# Patient Record
Sex: Male | Born: 1960 | Race: Black or African American | Hispanic: No | Marital: Married | Smoking: Current every day smoker
Health system: Southern US, Community
[De-identification: ages and names within clinical notes are randomized; demographics above are authoritative.]

---

## 2006-03-25 ENCOUNTER — Ambulatory Visit: Payer: Self-pay | Admitting: General Practice

## 2006-04-26 ENCOUNTER — Ambulatory Visit: Payer: Self-pay | Admitting: Urology

## 2007-05-10 IMAGING — CR DG WRIST COMPLETE 3+V*R*
1 series · 5 of 5 positions shown · non-contrast
Comparison: none

REASON FOR EXAM: Pain in right wrist
           Call results to physician at: [phone]
COMMENTS:

PROCEDURE:     DXR - DXR WRIST RT COMP WITH OBLIQUES  - March 25, 2006  [DATE]
RESULT:     There does not appear to be evidence of fracture, dislocation or
malalignment.

[Series 1: view not recorded · 0.17mm/px · 5 of 5 slices shown]
[im 1/5]
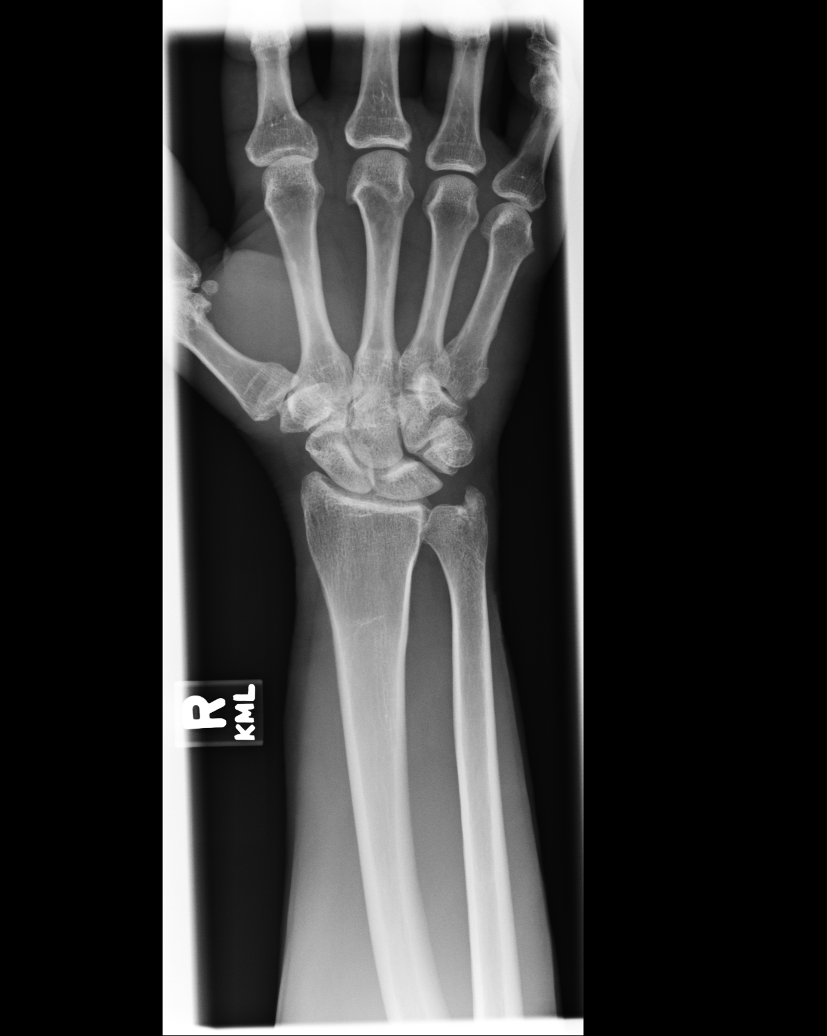
[im 2/5]
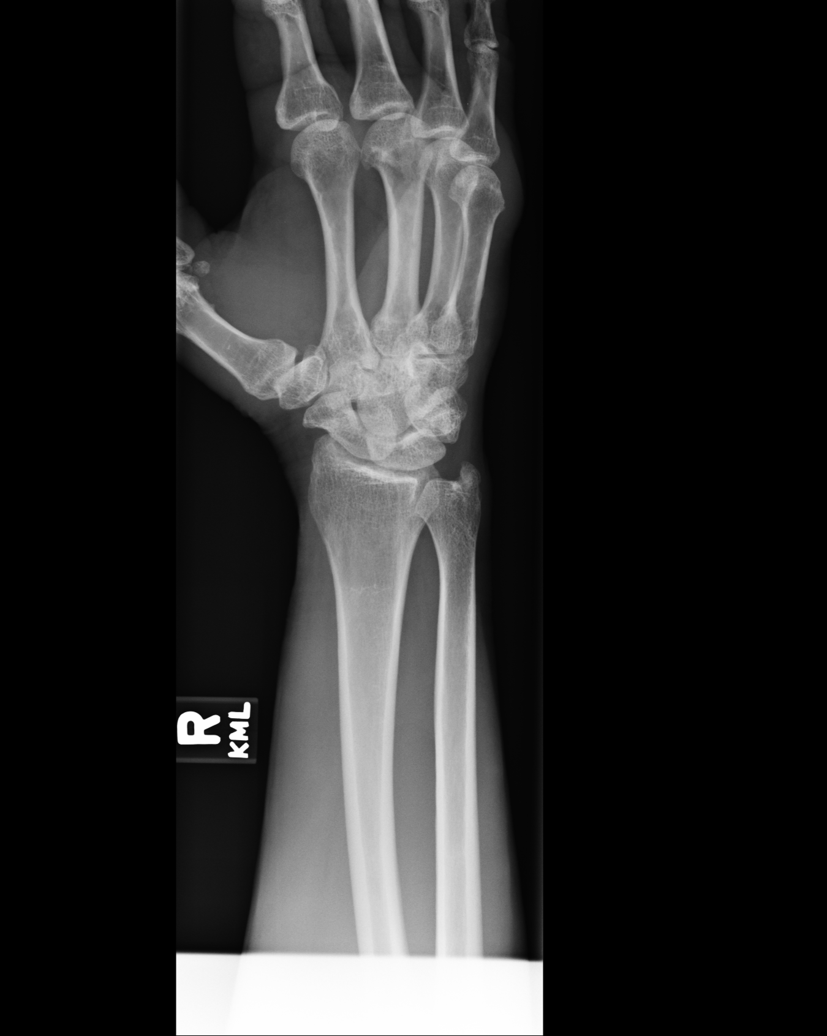
[im 3/5]
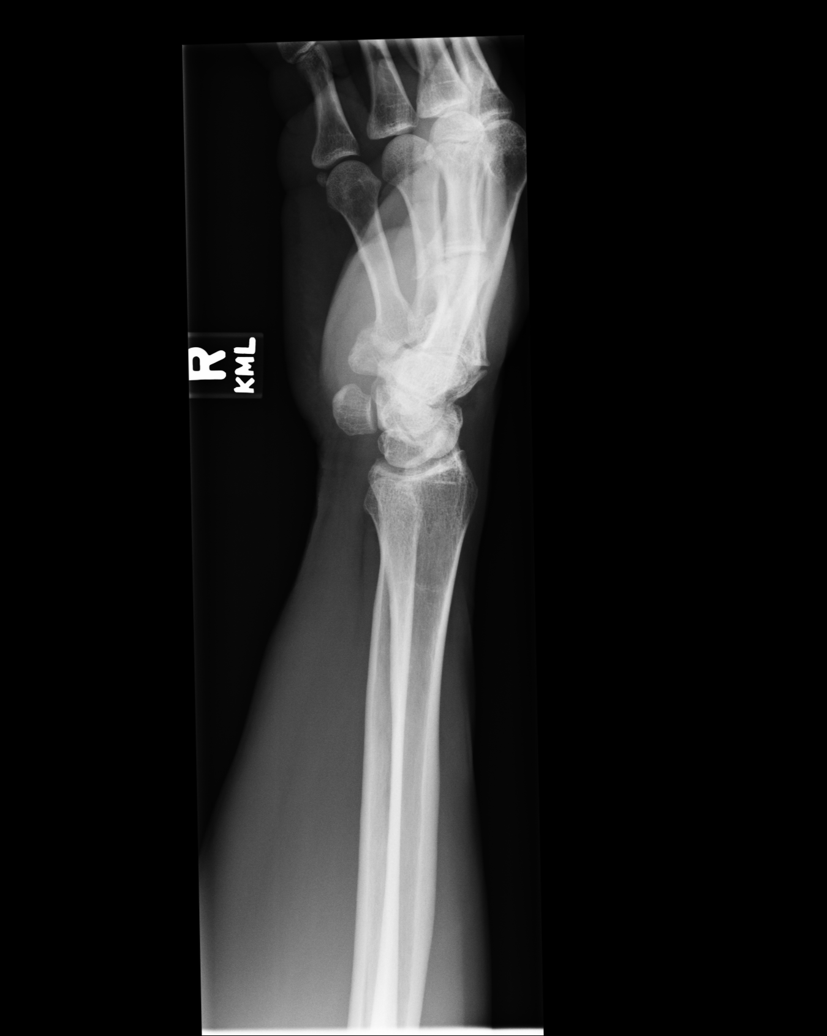
[im 4/5]
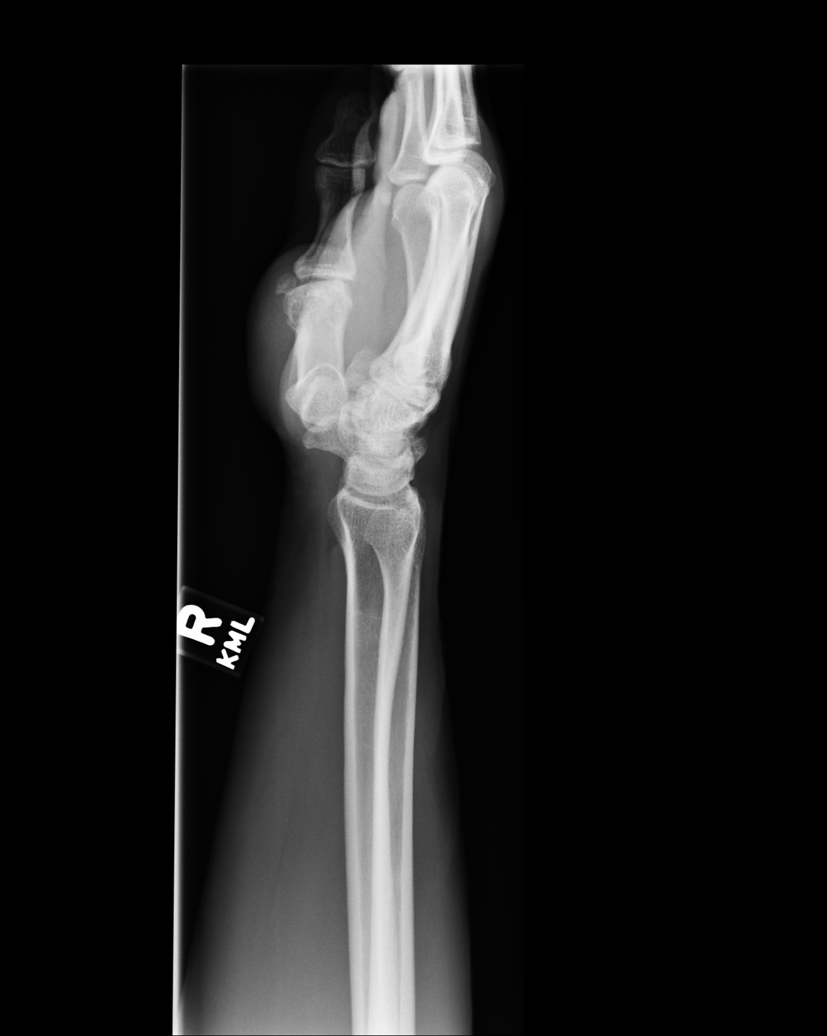
[im 5/5]
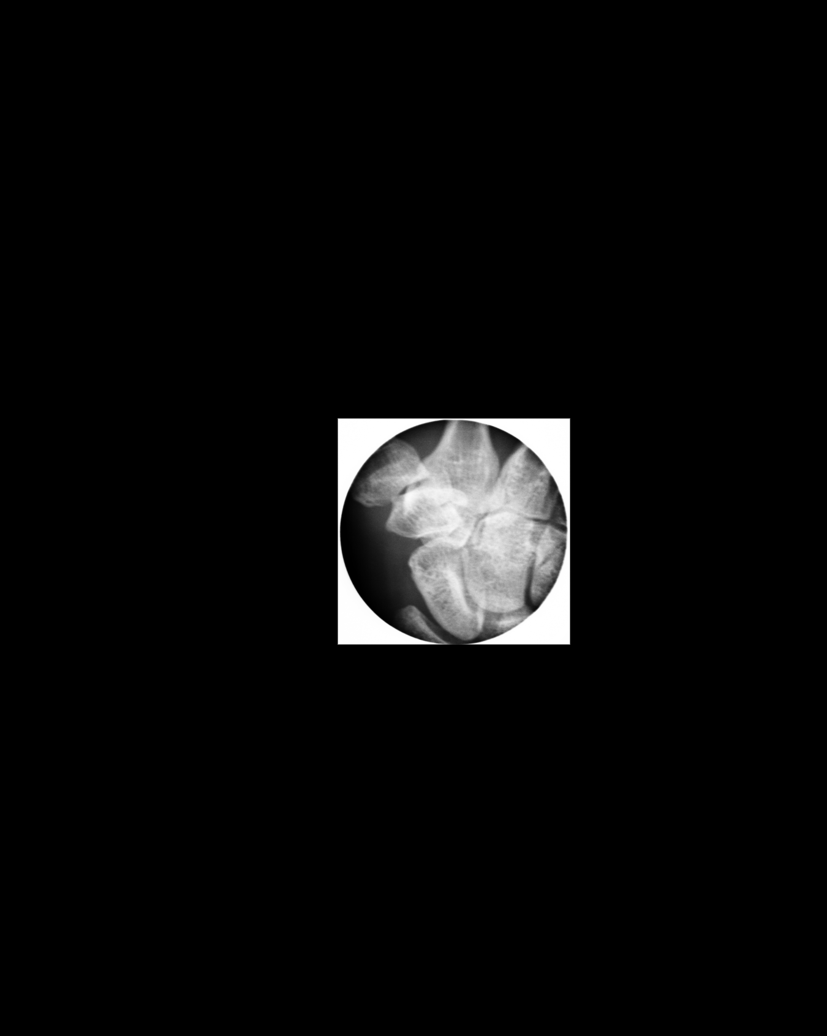

[5 of 5 positions shown; findings below may reference images not displayed]

IMPRESSION: 1.     Unremarkable RIGHT wrist.
2.     If there is persistent clinical concern or persistent complaints of
pain, repeat evaluation in 7-10 days is recommended, if clinically
warranted.

## 2015-09-21 ENCOUNTER — Emergency Department
Admission: EM | Admit: 2015-09-21 | Discharge: 2015-09-21 | Disposition: A | Discharge status: Home / Self Care | Payer: BLUE CROSS/BLUE SHIELD | Attending: Emergency Medicine | Admitting: Emergency Medicine

## 2015-09-21 DIAGNOSIS — Z720 Tobacco use: Secondary | ICD-10-CM | POA: Insufficient documentation

## 2015-09-21 DIAGNOSIS — K047 Periapical abscess without sinus: Secondary | ICD-10-CM | POA: Insufficient documentation

## 2015-09-21 MED ORDER — AMOXICILLIN 500 MG PO TABS: 500.0000 mg | ORAL_TABLET | Freq: Three times a day (TID) | ORAL | Status: AC

## 2015-09-21 MED ORDER — IBUPROFEN 800 MG PO TABS: 800.0000 mg | ORAL_TABLET | Freq: Three times a day (TID) | ORAL | Status: AC | PRN

## 2015-09-21 MED ORDER — TRAMADOL HCL 50 MG PO TABS: 50.0000 mg | ORAL_TABLET | Freq: Four times a day (QID) | ORAL | Status: AC | PRN

## 2015-09-21 NOTE — ED Provider Notes (Signed)
[facility], [facility] Emergency Department Provider Note ____________________________________________  Time seen: Approximately 3:19 PM  I have reviewed the triage vital signs and the nursing notes.   HISTORY  Chief Complaint Facial Swelling   HPI Cameron Hawkins is a 54 y.o. male who presents to the emergency department for evaluation of facial swelling. He noticed the swelling 3 days ago and it has progressively worsened. He states that quite some time ago a tooth broke in that area and believes that he now has some infection which is causing the swelling. He reports that the area is becoming quite tender. He states that he came to the emergency department today so that he could get on antibiotics before he goes to a dentist. He has not been taking anything for the pain. He has not been on any antibiotics in the last 30 days.   History reviewed. No pertinent past medical history.  There are no active problems to display for this patient.   History reviewed. No pertinent past surgical history.  Current Outpatient Rx  Name  Route  Sig  Dispense  Refill  . amoxicillin (AMOXIL) 500 MG tablet   Oral   Take 1 tablet (500 mg total) by mouth 3 (three) times daily.   30 tablet   0   . ibuprofen (ADVIL,MOTRIN) 800 MG tablet   Oral   Take 1 tablet (800 mg total) by mouth every 8 (eight) hours as needed.   30 tablet   0   . traMADol (ULTRAM) 50 MG tablet   Oral   Take 1 tablet (50 mg total) by mouth every 6 (six) hours as needed.   9 tablet   0     Allergies Review of patient's allergies indicates no known allergies.  History reviewed. No pertinent family history.  Social History Social History  Substance Use Topics  . Smoking status: Current Every Day Smoker  . Smokeless tobacco: None  . Alcohol Use: Yes    Review of Systems Constitutional: No fever/chills Eyes: No visual changes. ENT: No sore throat. Cardiovascular: Denies chest pain. Respiratory:  Denies shortness of breath. Gastrointestinal: No abdominal pain.  No nausea, no vomiting.  Genitourinary: Negative for dysuria. Musculoskeletal: Negative for back pain. Skin: Negative for rash. Neurological: Negative for headaches, focal weakness or numbness. 10-point ROS otherwise negative.  ____________________________________________   PHYSICAL EXAM:  VITAL SIGNS: ED Triage Vitals  Enc Vitals Group     BP 09/21/15 1431 125/84 mmHg     Pulse Rate 09/21/15 1431 89     Resp 09/21/15 1431 18     Temp 09/21/15 1431 97.9 F (36.6 C)     Temp Source 09/21/15 1431 Oral     SpO2 09/21/15 1431 99 %     Weight 09/21/15 1431 140 lb (63.504 kg)     Height --      Head Cir --      Peak Flow --      Pain Score 09/21/15 1432 8     Pain Loc --      Pain Edu? --      Excl. in GC? --     Constitutional: Alert and oriented. Well appearing and in no acute distress. Eyes: Conjunctivae are normal. PERRL. EOMI. Head: Atraumatic. Nose: No congestion/rhinnorhea. Mouth/Throat: Mucous membranes are moist.  Oropharynx non-erythematous. Periodontal Exam    Neck: No stridor.  Hematological/Lymphatic/Immunilogical: No cervical lymphadenopathy. Cardiovascular:   Good peripheral circulation. Respiratory: Normal respiratory effort.  No retractions. Musculoskeletal: No lower extremity  tenderness nor edema.  No joint effusions. Neurologic:  Normal speech and language. No gross focal neurologic deficits are appreciated. Speech is normal. No gait instability. Skin:  Skin is warm, dry and intact. No rash noted. Psychiatric: Mood and affect are normal. Speech and behavior are normal.  ____________________________________________   LABS (all labs ordered are listed, but only abnormal results are displayed)  Labs Reviewed - No data to display ____________________________________________   RADIOLOGY   ____________________________________________   PROCEDURES  Procedure(s) performed:    Critical Care performed:   ____________________________________________   INITIAL IMPRESSION / ASSESSMENT AND PLAN / ED COURSE  Pertinent labs & imaging results that were available during my care of the patient were reviewed by me and considered in my medical decision making (see chart for details).  Patient was advised to see the dentist within 14 days. Also advised to take the antibiotic until finished. Instructed to return to the ER for symptoms that change or worsen if you are unable to schedule an appointment. ____________________________________________   FINAL CLINICAL IMPRESSION(S) / ED DIAGNOSES  Final diagnoses:  Dental abscess      Chinita Pester, FNP 09/21/15 1530  Jeanmarie Plant, MD 09/21/15 [phone]

## 2015-09-21 NOTE — ED Notes (Signed)
Pt c/o swelling to face and dental pain x2 days.

## 2015-09-21 NOTE — Discharge Instructions (Signed)
Dental Abscess °A dental abscess is a collection of pus in or around a tooth. °CAUSES °This condition is caused by a bacterial infection around the root of the tooth that involves the inner part of the tooth (pulp). It may result from: °· Severe tooth decay. °· Trauma to the tooth that allows bacteria to enter into the pulp, such as a broken or chipped tooth. °· Severe gum disease around a tooth. °SYMPTOMS °Symptoms of this condition include: °· Severe pain in and around the infected tooth. °· Swelling and redness around the infected tooth, in the mouth, or in the face. °· Tenderness. °· Pus drainage. °· Bad breath. °· Bitter taste in the mouth. °· Difficulty swallowing. °· Difficulty opening the mouth. °· Nausea. °· Vomiting. °· Chills. °· Swollen neck glands. °· Fever. °DIAGNOSIS °This condition is diagnosed with examination of the infected tooth. During the exam, your dentist may tap on the infected tooth. Your dentist will also ask about your medical and dental history and may order X-rays. °TREATMENT °This condition is treated by eliminating the infection. This may be done with: °· Antibiotic medicine. °· A root canal. This may be performed to save the tooth. °· Pulling (extracting) the tooth. This may also involve draining the abscess. This is done if the tooth cannot be saved. °HOME CARE INSTRUCTIONS °· Take medicines only as directed by your dentist. °· If you were prescribed antibiotic medicine, finish all of it even if you start to feel better. °· Rinse your mouth (gargle) often with salt water to relieve pain or swelling. °· Do not drive or operate heavy machinery while taking pain medicine. °· Do not apply heat to the outside of your mouth. °· Keep all follow-up visits as directed by your dentist. This is important. °SEEK MEDICAL CARE IF: °· Your pain is worse and is not helped by medicine. °SEEK IMMEDIATE MEDICAL CARE IF: °· You have a fever or chills. °· Your symptoms suddenly get worse. °· You have a  very bad headache. °· You have problems breathing or swallowing. °· You have trouble opening your mouth. °· You have swelling in your neck or around your eye. °  °This information is not intended to replace advice given to you by your health care provider. Make sure you discuss any questions you have with your health care provider. °  °Document Released: 11/16/2005 Document Revised: 04/02/2015 Document Reviewed: 11/13/2014 °Elsevier Interactive Patient Education ©2016 [employer] ° ° ° ° ° ° °OPTIONS FOR DENTAL FOLLOW UP CARE ° °[place] [facility] and [facility] - Local Safety Net Dental Clinics °http://www.ncdhhs.gov/dph/oralhealth/services/safetynetclinics.htm °  °[facility] (336-562-3123) ° °[facility] [place] (919-933-9087) ° °[facility] (919-663-1744 ext [phone]) ° °[facility] (336-570-6415) ° °[facility] (919-968-2025) °This clinic caters to the indigent population and is on a lottery system. °Location: °[facility], [address], [place] °Clinic Hours: °Wednesdays from 6pm - 9pm, patients seen by a lottery system. °For dates, call or go to www.med.[facility].edu/[facility]/patients/Dental-[facility] °Services: °Cleanings, fillings and simple extractions. °Payment Options: °DENTAL WORK IS FREE OF CHARGE. Bring proof of income or support. °Best way to get seen: °Arrive at 5:15 pm - this is a lottery, NOT first come/first serve, so arriving earlier will not increase your chances of being seen. °  °  °[facility] °919-537-3737 °Select option 1 for emergencies °  °Location: °[facility], [address], [place] °  Clinic Hours: °No walk-ins accepted - call the day before to schedule an appointment. °Check in times are 9:30 am and 1:30 pm. °Services: °Simple extractions, temporary fillings, pulpectomy/pulp debridement, uncomplicated abscess drainage. °Payment Options: °PAYMENT IS  DUE AT THE TIME OF SERVICE.  Fee is usually $100-200, additional surgical procedures (e.g. abscess drainage) may be extra. °Cash, checks, Visa/MasterCard accepted.  Can file Medicaid if patient is covered for dental - patient should call case worker to check. °No discount for [facility] patients. °Best way to get seen: °MUST call the day before and get onto the schedule. Can usually be seen the next 1-2 days. No walk-ins accepted. °  °  °[facility] °[phone] °  °Location: °[facility], [address], [place] °Clinic Hours: °M, W, Th, F 8am or 1:30pm, Tues 9a or 1:30 - first come/first served. °Services: °Simple extractions, temporary fillings, uncomplicated abscess drainage.  You do not need to be an [place] resident. °Payment Options: °PAYMENT IS DUE AT THE TIME OF SERVICE. °Dental insurance, otherwise sliding scale - bring proof of income or support. °Depending on income and treatment needed, cost is usually $50-200. °Best way to get seen: °Arrive early as it is first come/first served. °  °  °[facility] °[phone] °  °Location: °[address] °Clinic Hours: °Mon-Thu 8a-5p °Services: °Most basic dental services including extractions and fillings. °Payment Options: °PAYMENT IS DUE AT THE TIME OF SERVICE. °Sliding scale, up to 50% off - bring proof if income or support. °Medicaid with dental option accepted. °Best way to get seen: °Call to schedule an appointment, can usually be seen within 2 weeks OR they will try to see walk-ins - show up at 8a or 2p (you may have to wait). °  °  °[facility] °919-245-2435 °[place] RESIDENTS ONLY °  °Location: °[facility], [address], [place], [place] [address] °Clinic Hours: By appointment only. °Monday - Thursday 8am-5pm, Friday 8am-12pm °Services: Cleanings, fillings, extractions. °Payment Options: °PAYMENT IS DUE AT THE TIME OF  SERVICE. °Cash, Visa or MasterCard. Sliding scale - $30 minimum per service. °Best way to get seen: °Come in to office, complete packet and make an appointment - need proof of income °or support monies for each household member and proof of [place] residence. °Usually takes about a month to get in. °  °  °[facility] °[phone] °  °Location: °[address] [place] °Clinic Hours: Walk-in Urgent Care Dental Services are offered Monday-Friday mornings only. °The numbers of emergencies accepted daily is limited to the number of °providers available. °Maximum 15 - Mondays, Wednesdays & Thursdays °Maximum 10 - Tuesdays & Fridays °Services: °You do not need to be a [place] resident to be seen for a dental emergency. °Emergencies are defined as pain, swelling, abnormal bleeding, or dental trauma. Walkins will receive x-rays if needed. °NOTE: Dental cleaning is not an emergency. °Payment Options: °PAYMENT IS DUE AT THE TIME OF SERVICE. °Minimum co-pay is $40.00 for uninsured patients. °Minimum co-pay is $3.00 for Medicaid with dental coverage. °Dental Insurance is accepted and must be presented at time of visit. °Medicare does not cover dental. °Forms of payment: Cash, credit card, checks. °Best way to get seen: °If not previously registered with the clinic, walk-in dental registration begins at 7:15 am and is on a first come/first serve basis. °If previously registered with the clinic, call to make an appointment. °  °  °  [facility] °[phone] °[place] RESIDENTS ONLY °  °Location: °[address], [place], [place] °Clinic Hours: °Mon-Thu 10a-2p °Services: Extractions only! °Payment Options: °FREE (donations accepted) - bring proof of income or support °Best way to get seen: °Call and schedule an appointment OR come at 8am on the 1st Monday of every month (except for holidays) when it is first come/first served. °  °  °[facility] °[phone] °   °Location: °[address], [place] °Clinic Hours: °Friday mornings °Services, Payment Options, Best way to get seen: °Call for info °
